# Patient Record
Sex: Female | Born: 1945 | Race: White | Hispanic: No | Marital: Married | Smoking: Never smoker
Health system: Southern US, Community
[De-identification: ages and names within clinical notes are randomized; demographics above are authoritative.]

## PROBLEM LIST (undated history)

## (undated) DIAGNOSIS — I38 Endocarditis, valve unspecified: Secondary | ICD-10-CM

## (undated) DIAGNOSIS — M1990 Unspecified osteoarthritis, unspecified site: Secondary | ICD-10-CM

## (undated) DIAGNOSIS — I10 Essential (primary) hypertension: Secondary | ICD-10-CM

## (undated) HISTORY — PX: SHOULDER SURGERY: SHX246

## (undated) HISTORY — PX: BACK SURGERY: SHX140

---

## 2021-08-12 ENCOUNTER — Emergency Department: Payer: Medicare Other

## 2021-08-12 ENCOUNTER — Other Ambulatory Visit: Payer: Self-pay

## 2021-08-12 ENCOUNTER — Emergency Department
Admission: EM | Admit: 2021-08-12 | Discharge: 2021-08-12 | Disposition: A | Discharge status: Home / Self Care | Payer: Medicare Other | Attending: Emergency Medicine | Admitting: Emergency Medicine

## 2021-08-12 DIAGNOSIS — M25561 Pain in right knee: Secondary | ICD-10-CM | POA: Insufficient documentation

## 2021-08-12 DIAGNOSIS — M7121 Synovial cyst of popliteal space [Baker], right knee: Secondary | ICD-10-CM | POA: Diagnosis not present

## 2021-08-12 DIAGNOSIS — M25461 Effusion, right knee: Secondary | ICD-10-CM | POA: Diagnosis present

## 2021-08-12 LAB — BASIC METABOLIC PANEL
Anion gap: 10 (ref 5–15)
BUN: 14 mg/dL (ref 8–23)
CO2: 27 mmol/L (ref 22–32)
Calcium: 9 mg/dL (ref 8.9–10.3)
Chloride: 94 mmol/L — ABNORMAL LOW (ref 98–111)
Creatinine, Ser: 0.84 mg/dL (ref 0.44–1.00)
GFR, Estimated: >60 mL/min (ref >60)
Glucose, Bld: 139 mg/dL — ABNORMAL HIGH (ref 70–99)
Potassium: 3.5 mmol/L (ref 3.5–5.1)
Sodium: 131 mmol/L — ABNORMAL LOW (ref 135–145)

## 2021-08-12 LAB — TROPONIN I (HIGH SENSITIVITY)
Troponin I (High Sensitivity): 6 ng/L (ref <18)
Troponin I (High Sensitivity): 6 ng/L (ref <18)

## 2021-08-12 LAB — CBC
HCT: 33.8 % — ABNORMAL LOW (ref 36.0–46.0)
Hemoglobin: 11.8 g/dL — ABNORMAL LOW (ref 12.0–15.0)
MCH: 31.4 pg (ref 26.0–34.0)
MCHC: 34.9 g/dL (ref 30.0–36.0)
MCV: 89.9 fL (ref 80.0–100.0)
Platelets: 249 10*3/uL (ref 150–400)
RBC: 3.76 MIL/uL — ABNORMAL LOW (ref 3.87–5.11)
RDW: 12.2 % (ref 11.5–15.5)
WBC: 9.1 10*3/uL (ref 4.0–10.5)
nRBC: 0 % (ref 0.0–0.2)

## 2021-08-12 NOTE — ED Provider Notes (Signed)
Emergency Medicine Provider Triage Evaluation Note  Mindy Mcdaniel, a 75 y.o. female  was evaluated in triage.  Pt complains of RLE swelling and right popliteal space fullness. She denies CP, SOB, or cough. She denies any recent trauma or falls.   Review of Systems  Positive: RLE swelling Negative: CP, SOB  Physical Exam  BP (!) 214/94 (BP Location: Left Arm)   Pulse 64   Temp 98.4 F (36.9 C) (Oral)   Resp 18   Ht 5\' 8"  (1.727 m)   Wt 65.8 kg   SpO2 98%   BMI 22.05 kg/m  Gen:   Awake, no distress  NAD Resp:  Normal effort CTA MSK:   Moves extremities without difficulty. Mild RLE edema. Popliteal space fullness, tenderness noted.  Other:  CVS: RRR. Normal distal pulses  Medical Decision Making  Medically screening exam initiated at 6:59 PM.  Appropriate orders placed.  Mindy Mcdaniel was informed that the remainder of the evaluation will be completed by another provider, this initial triage assessment does not replace that evaluation, and the importance of remaining in the ED until their evaluation is complete.  Patient with ED evaluation of RLE swelling and popliteal space fullness.    Virgel Manifold, PA-C 08/12/21 1901    08/14/21, MD 08/13/21 [id number]

## 2021-08-12 NOTE — Discharge Instructions (Addendum)
Please seek medical attention for any high fevers, chest pain, shortness of breath, change in behavior, persistent vomiting, bloody stool or any other new or concerning symptoms.  

## 2021-08-12 NOTE — ED Triage Notes (Signed)
Pt reports that the back of her knee is swollen, that she noticed it three days ago, it hurts when she ambulates on it. No known injury. She report that the right leg has swelling off and on but the knee has not gone down.

## 2021-08-12 NOTE — ED Provider Notes (Signed)
[facility] Emergency Department Provider Note   ____________________________________________   I have reviewed the triage vital signs and the nursing notes.   HISTORY  Chief Complaint Leg Swelling   History limited by: Not Limited   HPI Mindy Mcdaniel is a 75 y.o. female who presents to the emergency department today because of concern for pain and swelling behind her right knee. The patient states that her symptoms started roughly 3 days ago. She denies any trauma or unusual exertion prior to the pain starting. The patient states that she was concerned she might have a blood clot. Tried wearing some compression stockings but did not feel like they made a big difference.  Records reviewed. Per medical record review patient has a history of arthropathy    Prior to Admission medications   Not on File    Allergies Enalapril maleate, Ciprofloxacin, Amoxicillin, Enalapril, Other, and Trazodone  No family history on file.  Social History  Lives in [place]  Review of Systems Constitutional: No fever/chills Eyes: No visual changes. ENT: No sore throat. Cardiovascular: Denies chest pain. Respiratory: Denies shortness of breath. Gastrointestinal: No abdominal pain.  No nausea, no vomiting.  No diarrhea.   Genitourinary: Negative for dysuria. Musculoskeletal: Positive for right back of knee swelling and pain. Skin: Negative for rash. Neurological: Positive for headache.  ____________________________________________   PHYSICAL EXAM:  VITAL SIGNS: ED Triage Vitals  Enc Vitals Group     BP 08/12/21 1850 (!) 214/94     Pulse Rate 08/12/21 1850 64     Resp 08/12/21 1850 18     Temp 08/12/21 1850 98.4 F (36.9 C)     Temp Source 08/12/21 1850 Oral     SpO2 08/12/21 1850 98 %     Weight 08/12/21 1851 145 lb (65.8 kg)     Height 08/12/21 1851 5\' 8"  (1.727 m)     Head Circumference --      Peak Flow --      Pain Score 08/12/21 1851 0    Constitutional: Alert and oriented.  Eyes: Conjunctivae are normal.  ENT      Head: Normocephalic and atraumatic.      Nose: No congestion/rhinnorhea.      Mouth/Throat: Mucous membranes are moist.      Neck: No stridor. Hematological/Lymphatic/Immunilogical: No cervical lymphadenopathy. Cardiovascular: Normal rate, regular rhythm.  No murmurs, rubs, or gallops.  Respiratory: Normal respiratory effort without tachypnea nor retractions. Breath sounds are clear and equal bilaterally. No wheezes/rales/rhonchi. Gastrointestinal: Soft and non tender. No rebound. No guarding.  Genitourinary: Deferred Musculoskeletal: Right knee without any obvious effusion. No erythema. No warmth. No tenderness to palpation of the popliteal fossa.  Neurologic:  Normal speech and language. No gross focal neurologic deficits are appreciated.  Skin:  Skin is warm, dry and intact. No rash noted. Psychiatric: Mood and affect are normal. Speech and behavior are normal. Patient exhibits appropriate insight and judgment.  ____________________________________________    LABS (pertinent positives/negatives)  Trop hs 6 CBC wbc 9.1, hgb 11.8, plt 249 BMP na 131, k 3.5, glu 139  ____________________________________________   EKG  None  ____________________________________________    RADIOLOGY  08/14/21 venous right No DVT. Probable bakers cyst vs hematoma.  ____________________________________________   PROCEDURES  Procedures  ____________________________________________   INITIAL IMPRESSION / ASSESSMENT AND PLAN / ED COURSE  Pertinent labs & imaging results that were available during my care of the patient were reviewed by me and considered in my medical decision making (see chart  for details).   Patient presented to the emergency department today with concerns for back of right knee swelling and pain.  Denies any trauma or unusual exertion.  Ultrasound was performed which did not show any DVT.  It  was however consistent with Baker's cyst versus hematoma.  At this time I have lower suspicion for hematoma.  Did discuss Baker's cyst with patient.  She states that she already has a orthopedic doctor that she has a relationship with.  In addition she was found to have a slight hyponatremia.  We did discuss this.  Additionally she was found to have some hypotension.  She states she has had similar hypertension when visiting doctors in the past.  Discussed importance of primary care follow-up.  ____________________________________________   FINAL CLINICAL IMPRESSION(S) / ED DIAGNOSES  Final diagnoses:  Synovial cyst of right popliteal space     Note: This dictation was prepared with Dragon dictation. Any transcriptional errors that result from this process are unintentional     Phineas Semen, MD 08/12/21 2208

## 2021-11-13 ENCOUNTER — Emergency Department
Admission: EM | Admit: 2021-11-13 | Discharge: 2021-11-13 | Disposition: A | Discharge status: Home / Self Care | Payer: Medicare Other | Attending: Emergency Medicine | Admitting: Emergency Medicine

## 2021-11-13 ENCOUNTER — Other Ambulatory Visit: Payer: Self-pay

## 2021-11-13 DIAGNOSIS — U071 COVID-19: Secondary | ICD-10-CM | POA: Diagnosis not present

## 2021-11-13 DIAGNOSIS — R112 Nausea with vomiting, unspecified: Secondary | ICD-10-CM | POA: Diagnosis not present

## 2021-11-13 DIAGNOSIS — I10 Essential (primary) hypertension: Secondary | ICD-10-CM | POA: Insufficient documentation

## 2021-11-13 DIAGNOSIS — R509 Fever, unspecified: Secondary | ICD-10-CM | POA: Diagnosis present

## 2021-11-13 HISTORY — DX: Unspecified osteoarthritis, unspecified site: M19.90

## 2021-11-13 HISTORY — DX: Essential (primary) hypertension: I10

## 2021-11-13 LAB — RESP PANEL BY RT-PCR (FLU A&B, COVID) ARPGX2
Influenza A by PCR: NEGATIVE
Influenza B by PCR: NEGATIVE
SARS Coronavirus 2 by RT PCR: POSITIVE — AB

## 2021-11-13 MED ORDER — SODIUM CHLORIDE 0.9 % IV BOLUS
1000.0000 mL | Freq: Once | INTRAVENOUS | Status: AC
Start: 2021-11-13 — End: 2021-11-13
  Administered 2021-11-13: 18:00:00 1000 mL via INTRAVENOUS

## 2021-11-13 MED ORDER — ONDANSETRON 4 MG PO TBDP: 4.0000 mg | ORAL_TABLET | Freq: Three times a day (TID) | ORAL | 0 refills | Status: DC | PRN

## 2021-11-13 NOTE — ED Triage Notes (Signed)
Pt to ED for flu sx for the past few days, exposed to flu by grandkids. Pt in NAD

## 2021-11-13 NOTE — ED Provider Notes (Signed)
[facility] Emergency Department Provider Note ____________________________________________   Event Date/Time   First MD Initiated Contact with Patient 11/13/21 1748     (approximate)  I have reviewed the triage vital signs and the nursing notes.   HISTORY  Chief Complaint flu sx  HPI Mindy Mcdaniel is a 75 y.o. female with history of hypertension presents to the emergency department for treatment and evaluation of fever, nausea, vomiting, cough and congestion. Symptoms started 2 days ago. She was exposed to both Covid and Influenza over Christmas. Unable to tolerate food or fluids.      Past Medical History:  Diagnosis Date   Arthritis    Hypertension     There are no problems to display for this patient.   Prior to Admission medications   Medication Sig Start Date End Date Taking? Authorizing Provider  ondansetron (ZOFRAN-ODT) 4 MG disintegrating tablet Take 1 tablet (4 mg total) by mouth every 8 (eight) hours as needed for nausea or vomiting. 11/13/21  Yes Scott Vanderveer B, FNP    Allergies Enalapril maleate, Ciprofloxacin, Amoxicillin, Enalapril, Other, and Trazodone  No family history on file.  Social History    Review of Systems  Constitutional: Positive for fever/chills Eyes: No visual changes. ENT: No sore throat. Cardiovascular: Denies chest pain. Respiratory: Denies shortness of breath. Positive for cough. Gastrointestinal: No abdominal pain. No nausea, no vomiting. No diarrhea. No constipation. Genitourinary: Negative for dysuria. Musculoskeletal: Negative for back pain. Skin: Negative for rash. Neurological: Negative for headaches, focal weakness or numbness.  ____________________________________________   PHYSICAL EXAM:  VITAL SIGNS: ED Triage Vitals  Enc Vitals Group     BP 11/13/21 1611 (!) 146/101     Pulse Rate 11/13/21 1611 84     Resp 11/13/21 1611 18     Temp 11/13/21 1611 98.2 F (36.8 C)     Temp  Source 11/13/21 1611 Oral     SpO2 11/13/21 1611 94 %     Weight 11/13/21 1612 145 lb (65.8 kg)     Height 11/13/21 1612 5\' 8"  (1.727 m)     Head Circumference --      Peak Flow --      Pain Score 11/13/21 1612 0     Pain Loc --      Pain Edu? --      Excl. in GC? --     Constitutional: Alert and oriented. Well appearing and in no acute distress. Eyes: Conjunctivae are normal. Head: Atraumatic. Nose: Sinus congestion noted. Mouth/Throat: Mucous membranes are moist. Oropharynx non-erythematous. Neck: No stridor.   Hematological/Lymphatic/Immunilogical: No cervical lymphadenopathy. Cardiovascular: Normal rate, regular rhythm. Grossly normal heart sounds. Good peripheral circulation. Respiratory: Normal respiratory effort.  No retractions. Lungs CTAB. Gastrointestinal: Soft and nontender. No distention.  Musculoskeletal: No lower extremity tenderness nor edema.  No joint effusions. Neurologic:  Normal speech and language. No gross focal neurologic deficits are appreciated. No gait instability. Skin:  Skin is warm, dry and intact. No rash noted. Psychiatric: Mood and affect are normal. Speech and behavior are normal.  ____________________________________________   LABS (all labs ordered are listed, but only abnormal results are displayed)  Labs Reviewed  RESP PANEL BY RT-PCR (FLU A&B, COVID) ARPGX2 - Abnormal; Notable for the following components:      Result Value   SARS Coronavirus 2 by RT PCR POSITIVE (*)    All other components within normal limits   ____________________________________________  EKG  Not indicated. ____________________________________________  RADIOLOGY  ED MD  interpretation:    Not indicated.  I, Kem Boroughs, personally viewed and evaluated these images (plain radiographs) as part of my medical decision making, as well as reviewing the written report by the radiologist.  Official radiology report(s): No results  found.  ____________________________________________   PROCEDURES  Procedure(s) performed (including Critical Care):  Procedures  ____________________________________________   INITIAL IMPRESSION / ASSESSMENT AND PLAN     75 year old female presenting to the emergency department for treatment and evaluation of viral symptoms as described in the HPI. Awaiting results of respiratory panel. Fluids ordered.Patient denies nausea at this time.  COVID positive. Patient feeling better after fluids. She has been able to tolerate crackers and ginger ale without vomiting. ER return precautions discussed.     As part of my medical decision making, I reviewed the following data within the electronic MEDICAL RECORD NUMBER   ___________________________________________   FINAL CLINICAL IMPRESSION(S) / ED DIAGNOSES  Final diagnoses:  COVID     ED Discharge Orders          Ordered    ondansetron (ZOFRAN-ODT) 4 MG disintegrating tablet  Every 8 hours PRN        11/13/21 1935             Michaline Kindig was evaluated in Emergency Department on 11/14/2021 for the symptoms described in the history of present illness. She was evaluated in the context of the global COVID-19 pandemic, which necessitated consideration that the patient might be at risk for infection with the SARS-CoV-2 virus that causes COVID-19. Institutional protocols and algorithms that pertain to the evaluation of patients at risk for COVID-19 are in a state of rapid change based on information released by regulatory bodies including the CDC and federal and state organizations. These policies and algorithms were followed during the patient's care in the ED.   Note:  This document was prepared using Dragon voice recognition software and may include unintentional dictation errors.    Chinita Pester, FNP 11/14/21 1635    Sharyn Creamer, MD 11/18/21 1840

## 2023-01-04 IMAGING — US US EXTREM LOW VENOUS*R*
1 series · 14 of 24 positions shown · non-contrast
Comparison: None.

CLINICAL DATA: Pain behind right knee.

EXAM:
Right LOWER EXTREMITY VENOUS DOPPLER ULTRASOUND
TECHNIQUE: Gray-scale sonography with compression, as well as color and duplex
ultrasound, were performed to evaluate the deep venous system(s)
from the level of the common femoral vein through the popliteal and
proximal calf veins.

[Series 1: us venous img lower uni right (dvt) · portal-venous · 14 of 42 slices shown]
[im 1/42]
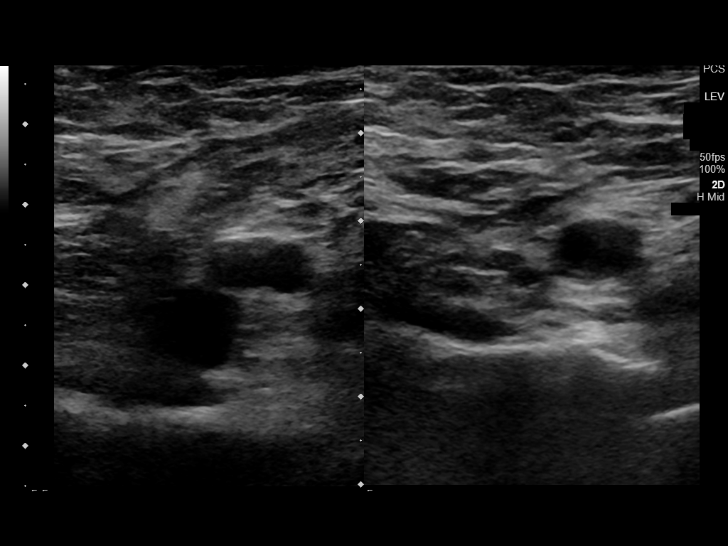
[im 4/42]
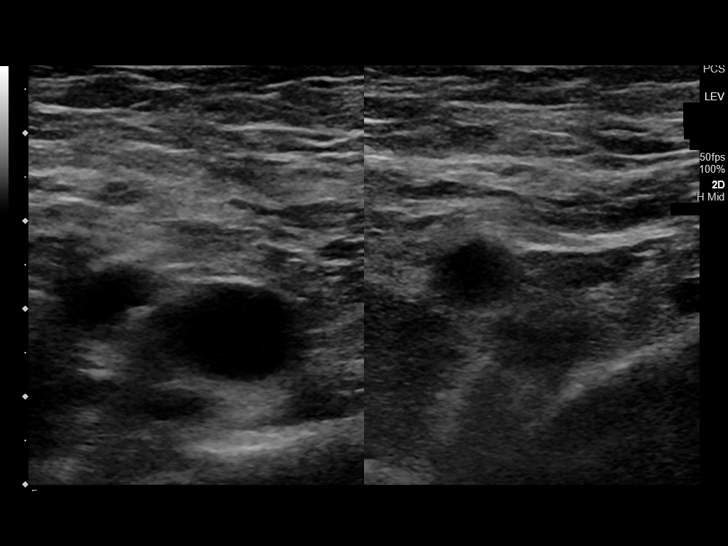
[im 8/42]
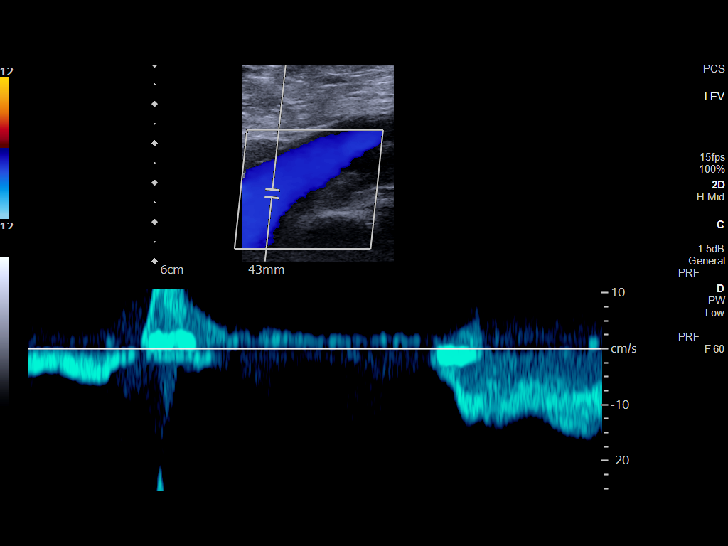
[im 11/42]
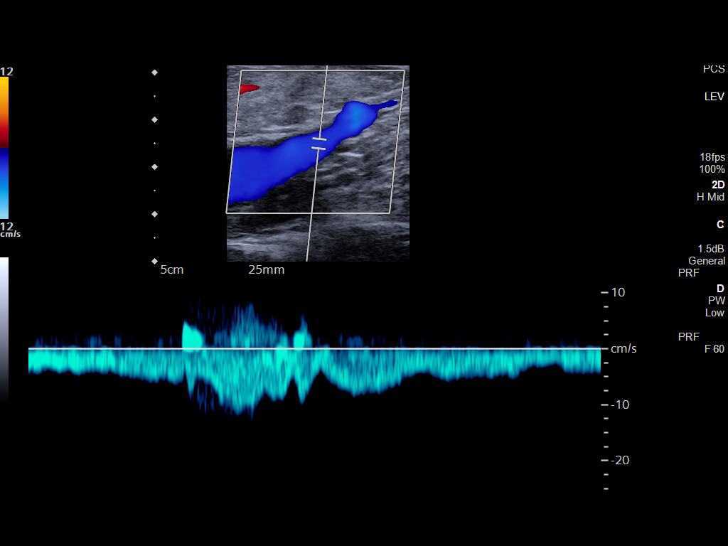
[im 13/42]
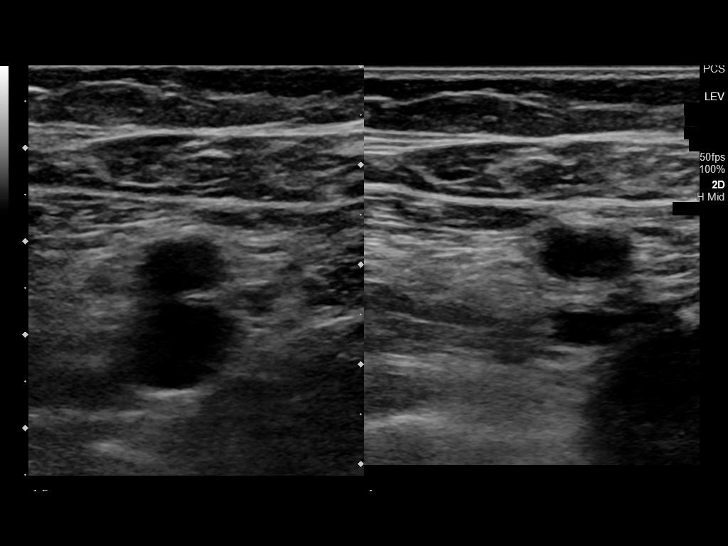
[im 17/42]
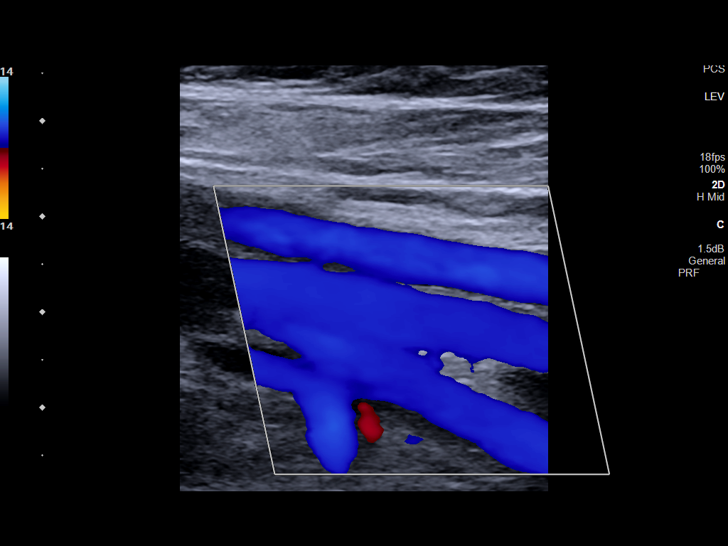
[im 20/42]
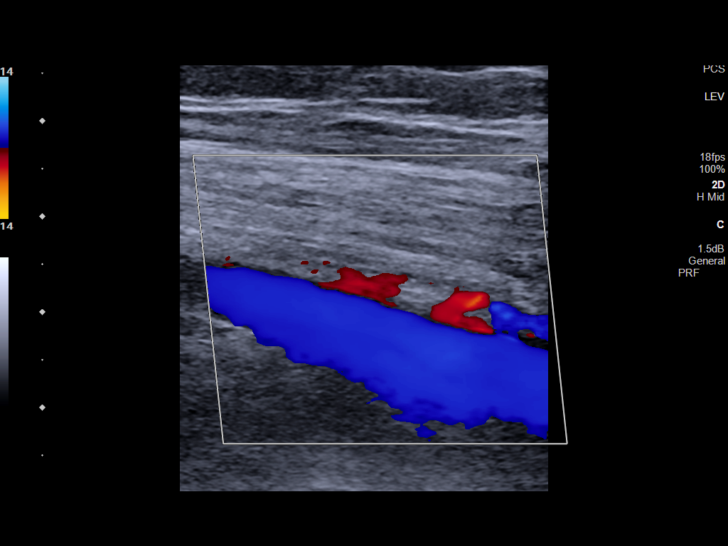
[im 22/42]
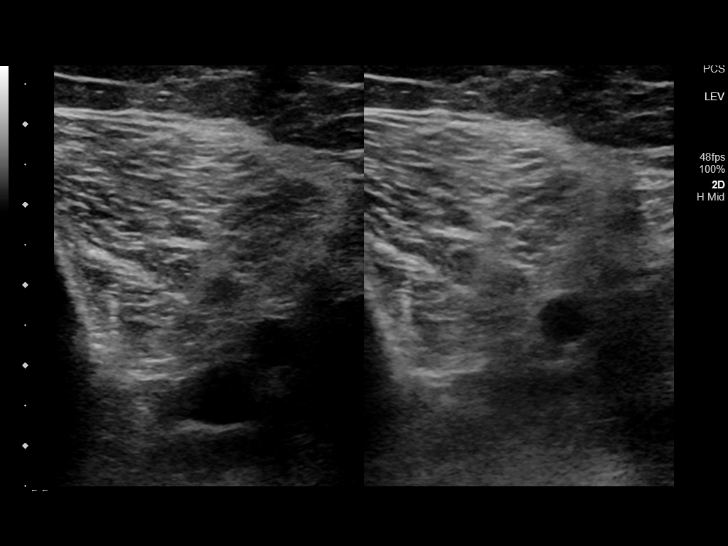
[im 25/42]
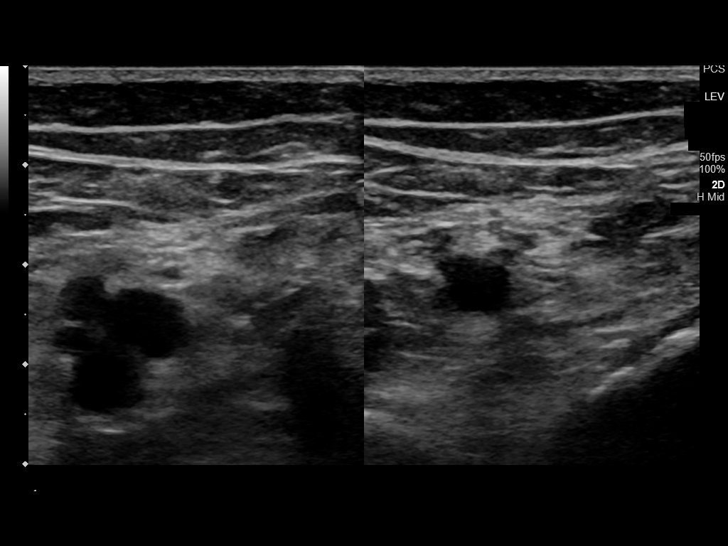
[im 29/42]
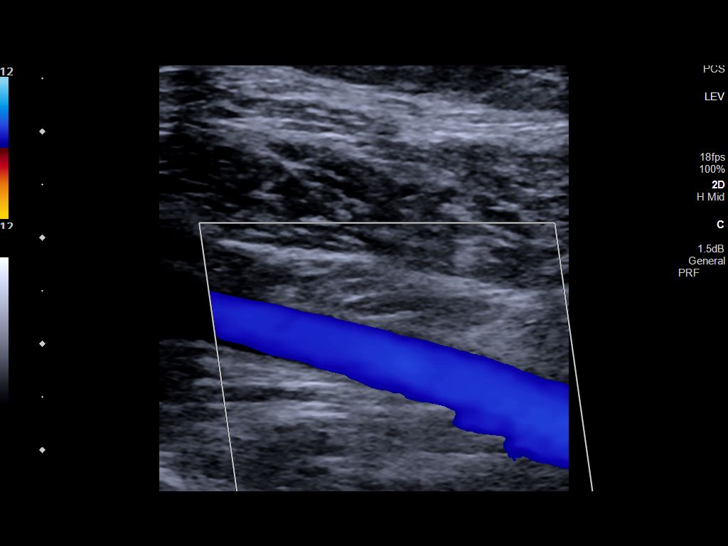
[im 33/42]
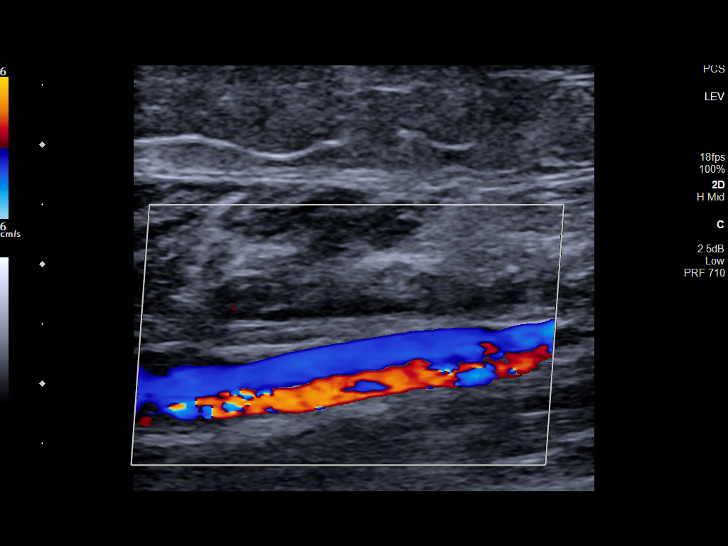
[im 34/42]
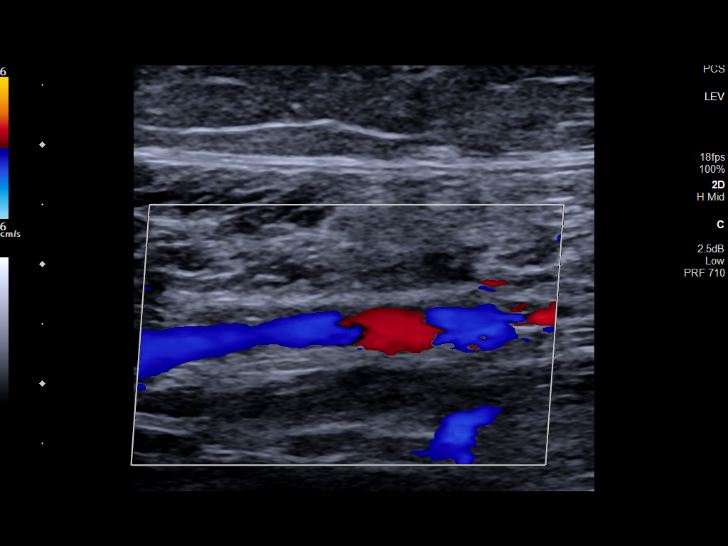
[im 38/42]
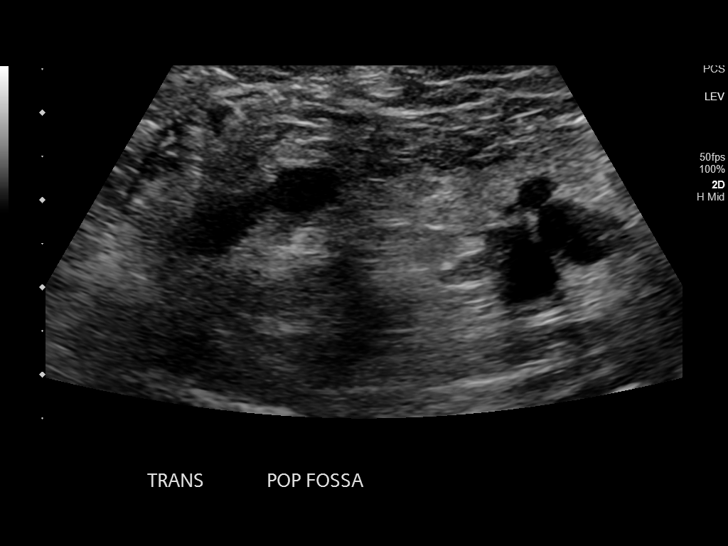
[im 42/42]
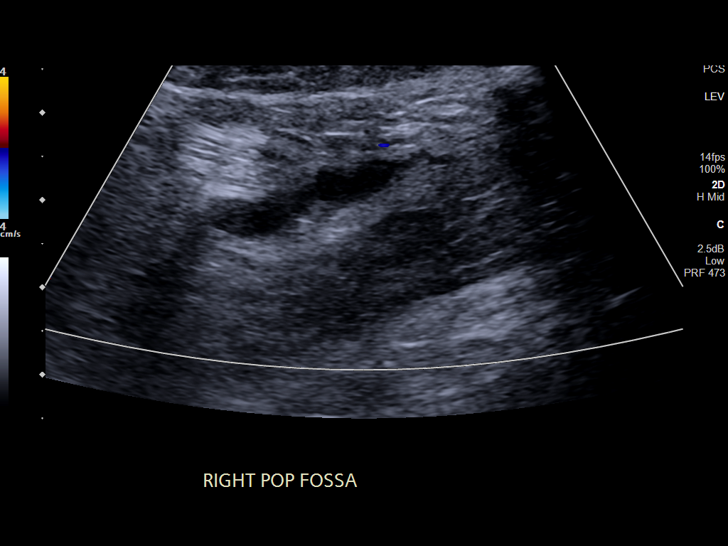

[14 of 24 positions shown; findings below may reference images not displayed]

FINDINGS: VENOUS

Normal compressibility of the common femoral, superficial femoral,
and popliteal veins, as well as the visualized calf veins.
Visualized portions of profunda femoral vein and great saphenous
vein unremarkable. No filling defects to suggest DVT on grayscale or
color Doppler imaging. Doppler waveforms show normal direction of
venous flow, normal respiratory plasticity and response to
augmentation.

Limited views of the contralateral common femoral vein are
unremarkable.

OTHER

There is a popliteal fossa 2.5 x 0.6 x 2 cm fluid density lesion.

Limitations: none
IMPRESSION: 1. No right lower extremity deep venous thrombosis.
2. A popliteal fossa 2.5 x 0.6 x 2 cm fluid density lesion may
represent a Baker's cyst versus liquified hematoma.

## 2023-03-24 ENCOUNTER — Emergency Department: Payer: Medicare PPO

## 2023-03-24 ENCOUNTER — Encounter: Payer: Self-pay | Admitting: Intensive Care

## 2023-03-24 ENCOUNTER — Other Ambulatory Visit: Payer: Self-pay

## 2023-03-24 ENCOUNTER — Emergency Department
Admission: EM | Admit: 2023-03-24 | Discharge: 2023-03-24 | Disposition: A | Discharge status: Home / Self Care | Payer: Medicare PPO | Attending: Emergency Medicine | Admitting: Emergency Medicine

## 2023-03-24 DIAGNOSIS — D72819 Decreased white blood cell count, unspecified: Secondary | ICD-10-CM | POA: Insufficient documentation

## 2023-03-24 DIAGNOSIS — J189 Pneumonia, unspecified organism: Secondary | ICD-10-CM

## 2023-03-24 DIAGNOSIS — R8289 Other abnormal findings on cytological and histological examination of urine: Secondary | ICD-10-CM | POA: Diagnosis not present

## 2023-03-24 DIAGNOSIS — J181 Lobar pneumonia, unspecified organism: Secondary | ICD-10-CM | POA: Diagnosis not present

## 2023-03-24 DIAGNOSIS — E871 Hypo-osmolality and hyponatremia: Secondary | ICD-10-CM | POA: Diagnosis not present

## 2023-03-24 DIAGNOSIS — Z20822 Contact with and (suspected) exposure to covid-19: Secondary | ICD-10-CM | POA: Diagnosis not present

## 2023-03-24 DIAGNOSIS — R112 Nausea with vomiting, unspecified: Secondary | ICD-10-CM | POA: Diagnosis present

## 2023-03-24 HISTORY — DX: Endocarditis, valve unspecified: I38

## 2023-03-24 LAB — URINALYSIS, ROUTINE W REFLEX MICROSCOPIC
Bilirubin Urine: NEGATIVE
Glucose, UA: NEGATIVE mg/dL
Hgb urine dipstick: NEGATIVE
Ketones, ur: NEGATIVE mg/dL
Leukocytes,Ua: NEGATIVE
Nitrite: NEGATIVE
Protein, ur: NEGATIVE mg/dL
Specific Gravity, Urine: 1.011 (ref 1.005–1.030)
Squamous Epithelial / HPF: >50 /HPF (ref 0–5)
pH: 5 (ref 5.0–8.0)

## 2023-03-24 LAB — HEPATIC FUNCTION PANEL
ALT: 16 U/L (ref 0–44)
AST: 26 U/L (ref 15–41)
Albumin: 3.4 g/dL — ABNORMAL LOW (ref 3.5–5.0)
Alkaline Phosphatase: 53 U/L (ref 38–126)
Bilirubin, Direct: <0.1 mg/dL (ref 0.0–0.2)
Total Bilirubin: 0.5 mg/dL (ref 0.3–1.2)
Total Protein: 6.6 g/dL (ref 6.5–8.1)

## 2023-03-24 LAB — BASIC METABOLIC PANEL
Anion gap: 11 (ref 5–15)
BUN: 9 mg/dL (ref 8–23)
CO2: 27 mmol/L (ref 22–32)
Calcium: 8.4 mg/dL — ABNORMAL LOW (ref 8.9–10.3)
Chloride: 93 mmol/L — ABNORMAL LOW (ref 98–111)
Creatinine, Ser: 0.76 mg/dL (ref 0.44–1.00)
GFR, Estimated: >60 mL/min (ref >60)
Glucose, Bld: 113 mg/dL — ABNORMAL HIGH (ref 70–99)
Potassium: 3.6 mmol/L (ref 3.5–5.1)
Sodium: 131 mmol/L — ABNORMAL LOW (ref 135–145)

## 2023-03-24 LAB — CBC
HCT: 35.7 % — ABNORMAL LOW (ref 36.0–46.0)
Hemoglobin: 12 g/dL (ref 12.0–15.0)
MCH: 30.8 pg (ref 26.0–34.0)
MCHC: 33.6 g/dL (ref 30.0–36.0)
MCV: 91.5 fL (ref 80.0–100.0)
Platelets: 179 10*3/uL (ref 150–400)
RBC: 3.9 MIL/uL (ref 3.87–5.11)
RDW: 12.8 % (ref 11.5–15.5)
WBC: 3.6 10*3/uL — ABNORMAL LOW (ref 4.0–10.5)
nRBC: 0 % (ref 0.0–0.2)

## 2023-03-24 LAB — RESP PANEL BY RT-PCR (RSV, FLU A&B, COVID)  RVPGX2
Influenza A by PCR: NEGATIVE
Influenza B by PCR: NEGATIVE
Resp Syncytial Virus by PCR: NEGATIVE
SARS Coronavirus 2 by RT PCR: NEGATIVE

## 2023-03-24 LAB — LIPASE, BLOOD: Lipase: 26 U/L (ref 11–51)

## 2023-03-24 MED ORDER — ONDANSETRON 4 MG PO TBDP: 4.0000 mg | ORAL_TABLET | Freq: Three times a day (TID) | ORAL | 0 refills | Status: DC | PRN

## 2023-03-24 MED ORDER — AZITHROMYCIN 250 MG PO TABS: ORAL_TABLET | ORAL | 0 refills | Status: DC

## 2023-03-24 MED ORDER — SODIUM CHLORIDE 0.9 % IV SOLN
1.0000 g | Freq: Once | INTRAVENOUS | Status: AC
  Administered 2023-03-24: 1 g via INTRAVENOUS
  Filled 2023-03-24: qty 10

## 2023-03-24 MED ORDER — IOHEXOL 300 MG/ML  SOLN
100.0000 mL | Freq: Once | INTRAMUSCULAR | Status: AC | PRN
  Administered 2023-03-24: 100 mL via INTRAVENOUS

## 2023-03-24 NOTE — ED Provider Notes (Signed)
[facility] Provider Note  Patient Contact: 6:03 PM (approximate)   History   Weakness   HPI  Mindy Mcdaniel is a 77 y.o. female who presents emergency department complaining of ongoing nausea, emesis.  Patient states that she has had some intermittent nausea and emesis for several months.  States that she had emesis yesterday and today prompting her to seek evaluation.  She has no fevers, chills, abdominal pain.  No diarrhea, constipation, dysuria, polyuria, hematuria.  She had similar symptoms in the past with medications.  No recent sick contacts.  Patient does also report a recent URI infection that seems to resolved.     Physical Exam   Triage Vital Signs: ED Triage Vitals  Enc Vitals Group     BP 03/24/23 1541 (!) 140/77     Pulse Rate 03/24/23 1541 75     Resp 03/24/23 1541 16     Temp 03/24/23 1541 98.4 F (36.9 C)     Temp Source 03/24/23 1541 Oral     SpO2 03/24/23 1541 96 %     Weight 03/24/23 1543 140 lb (63.5 kg)     Height 03/24/23 1543 5\' 8"  (1.727 m)     Head Circumference --      Peak Flow --      Pain Score 03/24/23 1542 8     Pain Loc --      Pain Edu? --      Excl. in GC? --     Most recent vital signs: Vitals:   03/24/23 1541  BP: (!) 140/77  Pulse: 75  Resp: 16  Temp: 98.4 F (36.9 C)  SpO2: 96%     General: Alert and in no acute distress. ENT:      Ears:       Nose: No congestion/rhinnorhea.      Mouth/Throat: Mucous membranes are moist. Neck: No stridor. No cervical spine tenderness to palpation. Hematological/Lymphatic/Immunilogical: No cervical lymphadenopathy.* Cardiovascular:  Good peripheral perfusion Respiratory: Normal respiratory effort without tachypnea or retractions. Lungs CTAB. Good air entry to the bases with no decreased or absent breath sounds. Gastrointestinal: Bowel sounds 4 quadrants. Soft and nontender to palpation. No guarding or rigidity. No palpable masses. No distention. No CVA  tenderness. Musculoskeletal: Full range of motion to all extremities.  Neurologic:  No gross focal neurologic deficits are appreciated.  Skin:   No rash noted Other:   ED Results / Procedures / Treatments   Labs (all labs ordered are listed, but only abnormal results are displayed) Labs Reviewed  BASIC METABOLIC PANEL - Abnormal; Notable for the following components:      Result Value   Sodium 131 (*)    Chloride 93 (*)    Glucose, Bld 113 (*)    Calcium 8.4 (*)    All other components within normal limits  CBC - Abnormal; Notable for the following components:   WBC 3.6 (*)    HCT 35.7 (*)    All other components within normal limits  URINALYSIS, ROUTINE W REFLEX MICROSCOPIC - Abnormal; Notable for the following components:   Color, Urine YELLOW (*)    APPearance TURBID (*)    Bacteria, UA MANY (*)    All other components within normal limits  HEPATIC FUNCTION PANEL - Abnormal; Notable for the following components:   Albumin 3.4 (*)    All other components within normal limits  RESP PANEL BY RT-PCR (RSV, FLU A&B, COVID)  [id number]  LIPASE, BLOOD  EKG  ED ECG REPORT I, Delorise Royals Keymari Sato,  personally viewed and interpreted this ECG.   Date: 03/24/2023  EKG Time: 1550 hrs.  Rate: 79 bpm  Rhythm: unchanged from previous tracings, normal sinus rhythm  Axis: Normal axis  Intervals:none  ST&T Change: No gross ST elevation or depression noted  Normal sinus rhythm.  No STEMI.  No significant change from previous EKG from 08/12/2021    RADIOLOGY  I personally viewed, evaluated, and interpreted these images as part of my medical decision making, as well as reviewing the written report by the radiologist.  ED Provider Interpretation: Chest x-ray with findings concerning for possible lesion in the right midlung.  CT scan performed revealed findings concerning for pneumonia in the left lower lung base, and scarring in the right midlung which is consistent with previous  x-ray. Diverticulosis without diverticulitis in the abdomen, no other concerning findings in the abdomen on CT.  CT CHEST ABDOMEN PELVIS W CONTRAST  Result Date: 03/24/2023 CLINICAL DATA:  Nausea, vomiting, abnormal chest radiograph EXAM: CT CHEST, ABDOMEN, AND PELVIS WITH CONTRAST TECHNIQUE: Multidetector CT imaging of the chest, abdomen and pelvis was performed following the standard protocol during bolus administration of intravenous contrast. RADIATION DOSE REDUCTION: This exam was performed according to the departmental dose-optimization program which includes automated exposure control, adjustment of the mA and/or kV according to patient size and/or use of iterative reconstruction technique. CONTRAST:  OMNIPAQUE IOHEXOL 300 MG/ML  SOLN COMPARISON:  Same-day chest radiograph FINDINGS: CT CHEST FINDINGS Cardiovascular: Aortic atherosclerosis. Normal heart size. Left coronary artery calcifications. No pericardial effusion. Mediastinum/Nodes: Numerous enlarged, coarsely calcified mediastinal and hilar lymph nodes. Small hiatal hernia. Thyroid gland, trachea, and esophagus demonstrate no significant findings. Lungs/Pleura: Scarring and volume loss of the lateral segment right middle lobe (series 3, image 70). Additional bandlike scarring and or atelectasis of the bilateral lung bases, with heterogeneous and ground-glass airspace opacity throughout the left lung base (series 4, image 111). No pleural effusion or pneumothorax. Musculoskeletal: No chest wall abnormality. No acute osseous findings. CT ABDOMEN PELVIS FINDINGS Hepatobiliary: No focal liver abnormality is seen. Status post cholecystectomy. No biliary dilatation. Pancreas: Unremarkable. No pancreatic ductal dilatation or surrounding inflammatory changes. Spleen: Normal in size without significant abnormality. Adrenals/Urinary Tract: Adrenal glands are unremarkable. Kidneys are normal, without renal calculi, solid lesion, or hydronephrosis. Bladder  is unremarkable. Stomach/Bowel: Stomach is within normal limits. Appendix is not clearly visualized and may be surgically absent. No evidence of bowel wall thickening, distention, or inflammatory changes. Sigmoid diverticulosis. Vascular/Lymphatic: Aortic atherosclerosis. No enlarged abdominal or pelvic lymph nodes. Reproductive: Status post hysterectomy. Other: No abdominal wall hernia or abnormality. No ascites. Musculoskeletal: No acute osseous findings. IMPRESSION: 1. Scarring and volume loss of the lateral segment right middle lobe, corresponding to finding of prior radiographs. 2. Additional bandlike scarring and or atelectasis of the bilateral lung bases, with heterogeneous and ground-glass airspace opacity throughout the left lung base. Findings are most consistent with infection or aspiration as well as chronic sequelae. 3. Numerous enlarged, coarsely calcified mediastinal and hilar lymph nodes, consistent with nodal sarcoidosis or prior granulomatous infection. 4. No acute findings in the abdomen or pelvis. 5. Sigmoid diverticulosis without evidence of acute diverticulitis. 6. Coronary artery disease. Aortic Atherosclerosis (ICD10-I70.0). Electronically Signed   By: Jearld Lesch M.D.   On: 03/24/2023 20:39   DG Chest 2 View  Result Date: 03/24/2023 CLINICAL DATA:  Nausea, vomiting. EXAM: CHEST - 2 VIEW COMPARISON:  None Available. FINDINGS: The heart size  and mediastinal contours are within normal limits. Lobular density is noted in right midlung. Minimal bibasilar subsegmental atelectasis is noted. The visualized skeletal structures are unremarkable. IMPRESSION: Lobular density seen in right midlung which may represent inflammation, but neoplasm cannot be excluded. CT scan of the chest with intravenous contrast is recommended for further evaluation. Electronically Signed   By: Lupita Raider M.D.   On: 03/24/2023 18:24    PROCEDURES:  Critical Care performed: No  Procedures   MEDICATIONS  ORDERED IN ED: Medications  cefTRIAXone (ROCEPHIN) 1 g in sodium chloride 0.9 % 100 mL IVPB (has no administration in time range)  iohexol (OMNIPAQUE) 300 MG/ML solution 100 mL (100 mLs Intravenous Contrast Given 03/24/23 2012)     IMPRESSION / MDM / ASSESSMENT AND PLAN / ED COURSE  I reviewed the triage vital signs and the nursing notes.                                 Differential diagnosis includes, but is not limited to, gastritis, gastroenteritis, medication side effect, colitis, diverticulitis, UTI, pyelonephritis, pneumonia   Patient's presentation is most consistent with acute presentation with potential threat to life or bodily function.   Patient's diagnosis is consistent with nausea and vomiting, pneumonia.  Patient presents emergency department with complaints of chronic nausea that has been worse over the last 2 days.  Patient endorses a recent URI symptoms but states that the majority of those complaints to include congestion, sore throat cough had improved.  Patient was complaining of increased nausea and had episodes of emesis yesterday and today.  No pain was described with chest or abdomen.  Overall pretty reassuring exam.  Patient had labs, EKG, chest x-ray, CT scan.  Labs revealed slightly decreased white blood cell count, slight decrease in her sodium but otherwise relatively reassuring.  She did have some bacteria in her urine but no nitrites or leukocytes and no dysuria.  Patient's chest x-ray revealed possible mass in the right mid lobe of the lung.  CT scan of the chest abdomen pelvis revealed diverticulosis without diverticulitis and no other findings on CT to explain nausea and vomiting, chest CT component revealed scarring in the mid lobe which was seen as a possible mass on x-ray.  Patient also has findings of early infection in the left lower lobe.  Given the drop in white blood cell count, recent URI, increased symptoms suspect that patient does not fact have  pneumonia.  Will treat with Rocephin, azithromycin as an outpatient.  Antiemetics will be prescribed for the patient as well.  Concerning signs and symptoms and return precautions discussed with the patient.  Otherwise I recommend following up with primary care and GI.Marland Kitchen Patient is given ED precautions to return to the ED for any worsening or new symptoms.     FINAL CLINICAL IMPRESSION(S) / ED DIAGNOSES   Final diagnoses:  Pneumonia of left lower lobe due to infectious organism  Nausea and vomiting, unspecified vomiting type     Rx / DC Orders   ED Discharge Orders          Ordered    azithromycin (ZITHROMAX Z-PAK) 250 MG tablet        03/24/23 2110             Note:  This document was prepared using Dragon voice recognition software and may include unintentional dictation errors.   Racheal Patches, PA-C 03/24/23 2321  Dionne Bucy, MD 03/24/23 2328

## 2023-03-24 NOTE — ED Triage Notes (Signed)
Patient c/o Nausea, weakness, lightheadedness, diarrhea, and chills.

## 2023-10-05 ENCOUNTER — Telehealth: Payer: Self-pay | Admitting: Emergency Medicine

## 2023-10-05 ENCOUNTER — Emergency Department
Admission: EM | Admit: 2023-10-05 | Discharge: 2023-10-05 | Disposition: A | Discharge status: Home / Self Care | Payer: Medicare PPO | Attending: Emergency Medicine | Admitting: Emergency Medicine

## 2023-10-05 ENCOUNTER — Other Ambulatory Visit: Payer: Self-pay

## 2023-10-05 DIAGNOSIS — M25552 Pain in left hip: Secondary | ICD-10-CM | POA: Diagnosis present

## 2023-10-05 DIAGNOSIS — Z760 Encounter for issue of repeat prescription: Secondary | ICD-10-CM | POA: Diagnosis not present

## 2023-10-05 MED ORDER — TRAMADOL HCL 50 MG PO TABS: 50.0000 mg | ORAL_TABLET | Freq: Four times a day (QID) | ORAL | 0 refills | Status: DC | PRN

## 2023-10-05 MED ORDER — TRAMADOL HCL 50 MG PO TABS: 50.0000 mg | ORAL_TABLET | Freq: Four times a day (QID) | ORAL | 0 refills | Status: AC | PRN

## 2023-10-05 NOTE — ED Notes (Signed)
See triage note   Presents with left hip pain  States she needs a hip replacement  But has had a sore on ankle that needed to be clear first  States she ran out of tramadol

## 2023-10-05 NOTE — ED Provider Notes (Signed)
   [facility] Provider Note    Event Date/Time   First MD Initiated Contact with Patient 10/05/23 1038     (approximate)   History   Medication refill, left hip pain   HPI  Mindy Mcdaniel is a 77 y.o. 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  She was unable to have her orthopedist refill her tramadol, she reports it helps with her pain     Physical Exam   Triage Vital Signs: ED Triage Vitals  Encounter Vitals Group     BP 10/05/23 1025 (!) 168/84     Systolic BP Percentile --      Diastolic BP Percentile --      Pulse Rate 10/05/23 1025 82     Resp 10/05/23 1025 16     Temp 10/05/23 1025 98.9 F (37.2 C)     Temp Source 10/05/23 1025 Oral     SpO2 10/05/23 1025 100 %     Weight 10/05/23 1025 62.6 kg (138 lb)     Height 10/05/23 1025 1.727 m (5\' 8" )     Head Circumference --      Peak Flow --      Pain Score 10/05/23 1030 10     Pain Loc --      Pain Education --      Exclude from Growth Chart --     Most recent vital signs: Vitals:   10/05/23 1025  BP: (!) 168/84  Pulse: 82  Resp: 16  Temp: 98.9 F (37.2 C)  SpO2: 100%     General: Awake, no distress.  CV:  Good peripheral perfusion.  Resp:  Normal effort.  Abd:  No distention.  Other:     ED Results / Procedures / Treatments   Labs (all labs ordered are listed, but only abnormal results are displayed) Labs Reviewed - No data to display   EKG     RADIOLOGY     PROCEDURES:  Critical Care performed:   Procedures   MEDICATIONS ORDERED IN ED: Medications - No data to display   IMPRESSION / MDM / ASSESSMENT AND PLAN / ED COURSE  I reviewed the triage vital signs and the nursing notes. Patient's presentation is most consistent with acute, uncomplicated illness.  Patient with left hip pain,  chronic as detailed above.  Will refill prescription, outpatient follow-up recommended.        FINAL CLINICAL IMPRESSION(S) / ED DIAGNOSES   Final diagnoses:  Left hip pain  Medication refill     Rx / DC Orders   ED Discharge Orders          Ordered    traMADol (ULTRAM) 50 MG tablet  Every 6 hours PRN,   Status:  Discontinued        10/05/23 1051    traMADol (ULTRAM) 50 MG tablet  Every 6 hours PRN        10/05/23 1100             Note:  This document was prepared using Dragon voice recognition software and may include unintentional dictation errors.   Jene Every, MD 10/05/23 1212

## 2023-10-05 NOTE — ED Triage Notes (Signed)
Pt comes in wanting prescription of Tramadol refilled. Complains of pain in hip. Took ibuprofen at 0128 and 0600, Celebrex at 0700, nausea medication at 0530, and tylenol at 0151.

## 2023-10-05 NOTE — Telephone Encounter (Signed)
Pharmacy is closed

## 2023-10-06 ENCOUNTER — Emergency Department: Payer: Medicare PPO

## 2023-10-06 ENCOUNTER — Emergency Department
Admission: EM | Admit: 2023-10-06 | Discharge: 2023-10-06 | Disposition: A | Discharge status: Home / Self Care | Payer: Medicare PPO | Attending: Emergency Medicine | Admitting: Emergency Medicine

## 2023-10-06 ENCOUNTER — Other Ambulatory Visit: Payer: Self-pay

## 2023-10-06 DIAGNOSIS — R112 Nausea with vomiting, unspecified: Secondary | ICD-10-CM | POA: Insufficient documentation

## 2023-10-06 LAB — COMPREHENSIVE METABOLIC PANEL
ALT: 13 U/L (ref 0–44)
AST: 21 U/L (ref 15–41)
Albumin: 3.9 g/dL (ref 3.5–5.0)
Alkaline Phosphatase: 73 U/L (ref 38–126)
Anion gap: 10 (ref 5–15)
BUN: 10 mg/dL (ref 8–23)
CO2: 23 mmol/L (ref 22–32)
Calcium: 8.7 mg/dL — ABNORMAL LOW (ref 8.9–10.3)
Chloride: 96 mmol/L — ABNORMAL LOW (ref 98–111)
Creatinine, Ser: 0.66 mg/dL (ref 0.44–1.00)
GFR, Estimated: >60 mL/min (ref >60)
Glucose, Bld: 128 mg/dL — ABNORMAL HIGH (ref 70–99)
Potassium: 3.4 mmol/L — ABNORMAL LOW (ref 3.5–5.1)
Sodium: 129 mmol/L — ABNORMAL LOW (ref 135–145)
Total Bilirubin: 0.6 mg/dL (ref <1.2)
Total Protein: 7.2 g/dL (ref 6.5–8.1)

## 2023-10-06 LAB — URINALYSIS, ROUTINE W REFLEX MICROSCOPIC
Bilirubin Urine: NEGATIVE
Glucose, UA: NEGATIVE mg/dL
Hgb urine dipstick: NEGATIVE
Ketones, ur: NEGATIVE mg/dL
Leukocytes,Ua: NEGATIVE
Nitrite: NEGATIVE
Protein, ur: NEGATIVE mg/dL
Specific Gravity, Urine: 1.014 (ref 1.005–1.030)
pH: 5 (ref 5.0–8.0)

## 2023-10-06 LAB — CBC
HCT: 38.3 % (ref 36.0–46.0)
Hemoglobin: 13.2 g/dL (ref 12.0–15.0)
MCH: 30.8 pg (ref 26.0–34.0)
MCHC: 34.5 g/dL (ref 30.0–36.0)
MCV: 89.5 fL (ref 80.0–100.0)
Platelets: 286 10*3/uL (ref 150–400)
RBC: 4.28 MIL/uL (ref 3.87–5.11)
RDW: 11.8 % (ref 11.5–15.5)
WBC: 8.9 10*3/uL (ref 4.0–10.5)
nRBC: 0 % (ref 0.0–0.2)

## 2023-10-06 LAB — LIPASE, BLOOD: Lipase: 28 U/L (ref 11–51)

## 2023-10-06 MED ORDER — LACTATED RINGERS IV BOLUS
1000.0000 mL | Freq: Once | INTRAVENOUS | Status: AC
  Administered 2023-10-06: 1000 mL via INTRAVENOUS

## 2023-10-06 MED ORDER — IOHEXOL 300 MG/ML  SOLN
100.0000 mL | Freq: Once | INTRAMUSCULAR | Status: AC | PRN
  Administered 2023-10-06: 100 mL via INTRAVENOUS

## 2023-10-06 MED ORDER — TRAMADOL HCL 50 MG PO TABS
50.0000 mg | ORAL_TABLET | Freq: Once | ORAL | Status: AC
  Administered 2023-10-06: 50 mg via ORAL
  Filled 2023-10-06: qty 1

## 2023-10-06 MED ORDER — ONDANSETRON HCL 4 MG/2ML IJ SOLN
4.0000 mg | Freq: Once | INTRAMUSCULAR | Status: AC
  Administered 2023-10-06: 4 mg via INTRAVENOUS
  Filled 2023-10-06: qty 2

## 2023-10-06 NOTE — ED Triage Notes (Signed)
Pt here with nausea and vomiting for a while. Pt thinks it is due to the medications she is taking. Pt does not have an appetite. Pt states she was here recently for left hip pain. Pt does not have abd pain.

## 2023-10-06 NOTE — ED Provider Notes (Addendum)
7:11 PM Assumed care for off going team.   Blood pressure 128/74, pulse 74, temperature 98 F (36.7 C), temperature source Oral, resp. rate 17, height 5\' 8"  (1.727 m), weight 62.6 kg, SpO2 98%.  See their HPI for full report but in brief pending CT scan    IMPRESSION:  1. No acute process or explanation for patient's symptoms. No small  bowel obstruction.  2. Lack of visualization of the appendix; no pericecal inflammation  seen.  3. Incidental findings, including: Aortic Atherosclerosis  (ICD10-I70.0). Moderate pelvic floor laxity.    7:15 PM evaluated patient she is doing well abdomen is soft and nontender they feel comfortable with discharge home.  Incidental findings discussed with family and provided a copy of report  Upon discharge requesting tramadol for her chronic pain of her hip   Concha Se, MD 10/06/23 Prentice Docker    Concha Se, MD 10/06/23 1925

## 2023-10-06 NOTE — Discharge Instructions (Addendum)
Workup was reassuring.  Return to the ER for worsening symptoms or any other concerns.  Otherwise follow with your primary care doctor  IMPRESSION:  1. No acute process or explanation for patient's symptoms. No small  bowel obstruction.  2. Lack of visualization of the appendix; no pericecal inflammation  seen.  3. Incidental findings, including: Aortic Atherosclerosis  (ICD10-I70.0). Moderate pelvic floor laxity.

## 2023-10-06 NOTE — ED Provider Notes (Signed)
[facility] Provider Note    Event Date/Time   First MD Initiated Contact with Patient 10/06/23 1344     (approximate)   History   Nausea   HPI Mindy Mcdaniel is a 77 y.o. female presenting today for nausea and vomiting.  Patient reportedly has chronic history over the past year with similar symptoms.  States over the past 48 hours it worsened again.  She has also had diarrhea.  Denies abdominal pain anywhere recently but in the past has had epigastric and periumbilical pain.  Otherwise denies fever, chest pain, shortness of breath.  No dysuria.  Reports chronic pain symptoms in her left hip as well.  Separately, patient notes lack of appetite, difficulty sleeping, and anxiety at home.  She has been evaluated with MRIs of the abdomen as well as endoscopy and colonoscopy which have been negative for finding the answer to her symptoms with nausea and vomiting over the past year.  Has not been assessed by a psychiatrist for possible depression.     Physical Exam   Triage Vital Signs: ED Triage Vitals  Encounter Vitals Group     BP 10/06/23 1235 132/79     Systolic BP Percentile --      Diastolic BP Percentile --      Pulse Rate 10/06/23 1235 79     Resp 10/06/23 1235 18     Temp 10/06/23 1235 98.3 F (36.8 C)     Temp Source 10/06/23 1235 Oral     SpO2 10/06/23 1235 96 %     Weight 10/06/23 1234 138 lb 0.1 oz (62.6 kg)     Height 10/06/23 1234 5\' 8"  (1.727 m)     Head Circumference --      Peak Flow --      Pain Score 10/06/23 1234 0     Pain Loc --      Pain Education --      Exclude from Growth Chart --     Most recent vital signs: Vitals:   10/06/23 1235  BP: 132/79  Pulse: 79  Resp: 18  Temp: 98.3 F (36.8 C)  SpO2: 96%   Physical Exam: I have reviewed the vital signs and nursing notes. General: Awake, alert, no acute distress.  Nontoxic appearing. Head:  Atraumatic, normocephalic.   ENT:  EOM intact, PERRL. Oral mucosa is pink  and moist with no lesions. Neck: Neck is supple with full range of motion, No meningeal signs. Cardiovascular:  RRR, No murmurs. Peripheral pulses palpable and equal bilaterally. Respiratory:  Symmetrical chest wall expansion.  No rhonchi, rales, or wheezes.  Good air movement throughout.  No use of accessory muscles.   Musculoskeletal:  No cyanosis or edema. Moving extremities with full ROM Abdomen:  Soft, nontender, nondistended. Neuro:  GCS 15, moving all four extremities, interacting appropriately. Speech clear. Psych:  Calm, appropriate.   Skin:  Warm, dry, no rash.    ED Results / Procedures / Treatments   Labs (all labs ordered are listed, but only abnormal results are displayed) Labs Reviewed  COMPREHENSIVE METABOLIC PANEL - Abnormal; Notable for the following components:      Result Value   Sodium 129 (*)    Potassium 3.4 (*)    Chloride 96 (*)    Glucose, Bld 128 (*)    Calcium 8.7 (*)    All other components within normal limits  URINALYSIS, ROUTINE W REFLEX MICROSCOPIC - Abnormal; Notable for the following components:   Color,  Urine YELLOW (*)    APPearance CLOUDY (*)    All other components within normal limits  CBC  LIPASE, BLOOD     EKG    RADIOLOGY CT imaging pending at time of signout.   PROCEDURES:  Critical Care performed: No  Procedures   MEDICATIONS ORDERED IN ED: Medications  lactated ringers bolus 1,000 mL (1,000 mLs Intravenous New Bag/Given 10/06/23 1416)  ondansetron (ZOFRAN) injection 4 mg (4 mg Intravenous Given 10/06/23 1416)  iohexol (OMNIPAQUE) 300 MG/ML solution 100 mL (100 mLs Intravenous Contrast Given 10/06/23 1525)     IMPRESSION / MDM / ASSESSMENT AND PLAN / ED COURSE  I reviewed the triage vital signs and the nursing notes.                              Differential diagnosis includes, but is not limited to, SBO, cyclic vomiting syndrome, chronic depression  Patient's presentation is most consistent with acute  complicated illness / injury requiring diagnostic workup.  Patient is a 77 year old female presenting today for recurrent nausea and vomiting.  Symptoms worsened over the last 48 hours.  No abdominal pain.  Does have diarrhea.  Does have 27-month history of similar episodes that have had negative medical findings including MRI abdomen, endoscopy, and colonoscopy.  Separately, patient has noted decreased sleep, lack of appetite as well.  There has been concern for depression anxiety which she has never been evaluated for.  Laboratory workup shows mild dehydration with hyponatremia and hypochloremia likely secondary to her vomiting.  CBC and UA otherwise unremarkable.  Discussed that we will additional study that likely needs to be performed at this time will be a CT abdomen/pelvis to rule out abdominal obstruction.  Family does want this at this time.  Discussed that if this was negative, I am concerned given prior negative medical workups that symptoms could be related to depression with her chronic pain symptoms.  Patient and daughter do seem to agree that this would fit with what they have been experiencing.  Patient was signed out pending results of CT and if negative they are agreeable with discharge and follow-up with her PCP.  The patient is on the cardiac monitor to evaluate for evidence of arrhythmia and/or significant heart rate changes.     FINAL CLINICAL IMPRESSION(S) / ED DIAGNOSES   Final diagnoses:  Nausea and vomiting, unspecified vomiting type     Rx / DC Orders   ED Discharge Orders     None        Note:  This document was prepared using Dragon voice recognition software and may include unintentional dictation errors.   Janith Lima, MD 10/06/23 [phone]

## 2023-11-09 ENCOUNTER — Emergency Department
Admission: EM | Admit: 2023-11-09 | Discharge: 2023-11-09 | Disposition: A | Discharge status: Home / Self Care | Payer: Medicare PPO | Attending: Student in an Organized Health Care Education/Training Program | Admitting: Student in an Organized Health Care Education/Training Program

## 2023-11-09 ENCOUNTER — Emergency Department: Payer: Medicare PPO

## 2023-11-09 ENCOUNTER — Other Ambulatory Visit: Payer: Self-pay

## 2023-11-09 DIAGNOSIS — R410 Disorientation, unspecified: Secondary | ICD-10-CM | POA: Insufficient documentation

## 2023-11-09 LAB — BASIC METABOLIC PANEL
Anion gap: 10 (ref 5–15)
BUN: 9 mg/dL (ref 8–23)
CO2: 25 mmol/L (ref 22–32)
Calcium: 9 mg/dL (ref 8.9–10.3)
Chloride: 98 mmol/L (ref 98–111)
Creatinine, Ser: 0.87 mg/dL (ref 0.44–1.00)
GFR, Estimated: >60 mL/min (ref >60)
Glucose, Bld: 115 mg/dL — ABNORMAL HIGH (ref 70–99)
Potassium: 3.7 mmol/L (ref 3.5–5.1)
Sodium: 133 mmol/L — ABNORMAL LOW (ref 135–145)

## 2023-11-09 LAB — URINALYSIS, ROUTINE W REFLEX MICROSCOPIC
Bilirubin Urine: NEGATIVE
Glucose, UA: NEGATIVE mg/dL
Hgb urine dipstick: NEGATIVE
Ketones, ur: NEGATIVE mg/dL
Leukocytes,Ua: NEGATIVE
Nitrite: NEGATIVE
Protein, ur: NEGATIVE mg/dL
Specific Gravity, Urine: 1.013 (ref 1.005–1.030)
pH: 5 (ref 5.0–8.0)

## 2023-11-09 LAB — CBC WITH DIFFERENTIAL/PLATELET
Abs Immature Granulocytes: 0.02 10*3/uL (ref 0.00–0.07)
Basophils Absolute: 0 10*3/uL (ref 0.0–0.1)
Basophils Relative: 0 %
Eosinophils Absolute: 0.1 10*3/uL (ref 0.0–0.5)
Eosinophils Relative: 1 %
HCT: 37.2 % (ref 36.0–46.0)
Hemoglobin: 12.6 g/dL (ref 12.0–15.0)
Immature Granulocytes: 0 %
Lymphocytes Relative: 10 %
Lymphs Abs: 0.9 10*3/uL (ref 0.7–4.0)
MCH: 30 pg (ref 26.0–34.0)
MCHC: 33.9 g/dL (ref 30.0–36.0)
MCV: 88.6 fL (ref 80.0–100.0)
Monocytes Absolute: 0.6 10*3/uL (ref 0.1–1.0)
Monocytes Relative: 6 %
Neutro Abs: 7.4 10*3/uL (ref 1.7–7.7)
Neutrophils Relative %: 83 %
Platelets: 294 10*3/uL (ref 150–400)
RBC: 4.2 MIL/uL (ref 3.87–5.11)
RDW: 12.1 % (ref 11.5–15.5)
WBC: 9 10*3/uL (ref 4.0–10.5)
nRBC: 0 % (ref 0.0–0.2)

## 2023-11-09 MED ORDER — TRAMADOL HCL 50 MG PO TABS: 50.0000 mg | ORAL_TABLET | Freq: Four times a day (QID) | ORAL | 0 refills | Status: AC | PRN

## 2023-11-09 NOTE — ED Notes (Signed)
Patient transported to CT 

## 2023-11-09 NOTE — ED Triage Notes (Addendum)
Pt arrives with multiple complaints such as urinary frequency, increased pain in left hip, and decreased appetite. Per family member, pt is not sleeping due to pain in hip due needing surgery on hip. Pt is suppose to be taking a double dose of tramadol for pain. Per family member, pt had an episode of "talking out of her head" today. Pt denies dysuria, fevers, CP, or SOB. Pt a&ox4.

## 2023-11-09 NOTE — ED Provider Notes (Signed)
[facility] Provider Note    Event Date/Time   First MD Initiated Contact with Patient 11/09/23 1704     (approximate)   History   Multiple Complaints   HPI  Mindy Mcdaniel is a 77 y.o. female with a history of, presents to the ER for evaluation of confusion.  According family she is on tramadol for chronic left hip pain and was instructed to double up on her tramadol.  She was not given an extra prescription for this however and ran out of her tramadol and over the past 24 hours has gotten less than 4 hours of sleep.  Family notes that she was seeming confused and disoriented as well as slurring her speech when she was falling asleep.  Has been yawning most of the day very fatigued.  Denies any numbness or tingling.  No headaches.  No blurred vision.     Physical Exam   Triage Vital Signs: ED Triage Vitals  Encounter Vitals Group     BP 11/09/23 1654 (!) 169/80     Systolic BP Percentile --      Diastolic BP Percentile --      Pulse Rate 11/09/23 1654 87     Resp 11/09/23 1654 19     Temp 11/09/23 1654 98 F (36.7 C)     Temp Source 11/09/23 1654 Oral     SpO2 11/09/23 1654 100 %     Weight 11/09/23 1656 138 lb (62.6 kg)     Height --      Head Circumference --      Peak Flow --      Pain Score 11/09/23 1655 7     Pain Loc --      Pain Education --      Exclude from Growth Chart --     Most recent vital signs: Vitals:   11/09/23 1654 11/09/23 1735  BP: (!) 169/80 (!) 154/76  Pulse: 87 80  Resp: 19 17  Temp: 98 F (36.7 C)   SpO2: 100% 100%     Constitutional: Alert  Eyes: Conjunctivae are normal.  Head: Atraumatic. Nose: No congestion/rhinnorhea. Mouth/Throat: Mucous membranes are moist.   Neck: Painless ROM.  Cardiovascular:   Good peripheral circulation. Respiratory: Normal respiratory effort.  No retractions.  Gastrointestinal: Soft and nontender.  Musculoskeletal:  no deformity Neurologic:  CN- intact.  No facial droop,  Normal FNF.  Normal heel to shin.  Sensation intact bilaterally. Normal speech and language. No gross focal neurologic deficits are appreciated. No gait instability. Skin:  Skin is warm, dry and intact. No rash noted. Psychiatric: Mood and affect are normal. Speech and behavior are normal.    ED Results / Procedures / Treatments   Labs (all labs ordered are listed, but only abnormal results are displayed) Labs Reviewed  BASIC METABOLIC PANEL - Abnormal; Notable for the following components:      Result Value   Sodium 133 (*)    Glucose, Bld 115 (*)    All other components within normal limits  URINALYSIS, ROUTINE W REFLEX MICROSCOPIC - Abnormal; Notable for the following components:   Color, Urine YELLOW (*)    APPearance CLOUDY (*)    All other components within normal limits  CBC WITH DIFFERENTIAL/PLATELET       RADIOLOGY Please see ED Course for my review and interpretation.  I personally reviewed all radiographic images ordered to evaluate for the above acute complaints and reviewed radiology reports and findings.  These  findings were personally discussed with the patient.  Please see medical record for radiology report.    PROCEDURES:  Critical Care performed: No  Procedures   MEDICATIONS ORDERED IN ED: Medications - No data to display   IMPRESSION / MDM / ASSESSMENT AND PLAN / ED COURSE  I reviewed the triage vital signs and the nursing notes.                              Differential diagnosis includes, but is not limited to, Dehydration, sepsis, pna, uti, hypoglycemia, cva, drug effect, withdrawal, encephalitis  Patient presenting to the ER for evaluation of symptoms as described above.  Based on symptoms, risk factors and considered above differential, this presenting complaint could reflect a potentially life-threatening illness therefore the patient will be placed on continuous pulse oximetry and telemetry for monitoring.  Laboratory evaluation will be  sent to evaluate for the above complaints.      Clinical Course as of 11/09/23 1827  Wynelle Link Nov 09, 2023  1811 CT head on my review and interpretation without evidence of mass or bleed.  No acute findings per radiology.  Chest x-ray unchanged from prior.  No sign of pneumonia.  Blood work otherwise reassuring.  Awaiting urinalysis. [PR]  1820 Patient's workup is negative.  She is not exhibiting any signs of confusion or altered mental status at this time.  Is not consistent with CVA or TIA.  Do suspect likely component of fatigue as symptoms present around time she is trying to go to sleep.  She does not have a refill of her tramadol to be picked up until tomorrow morning therefore we will give her a short prescription sent to pharmacy to get her through until then.  She does appear stable and appropriate for outpatient follow-up. [PR]    Clinical Course User Index [PR] Willy Eddy, MD     FINAL CLINICAL IMPRESSION(S) / ED DIAGNOSES   Final diagnoses:  Confusion     Rx / DC Orders   ED Discharge Orders          Ordered    traMADol (ULTRAM) 50 MG tablet  Every 6 hours PRN        11/09/23 1826             Note:  This document was prepared using Dragon voice recognition software and may include unintentional dictation errors.    Willy Eddy, MD 11/09/23 [phone]
